# Patient Record
Sex: Male | Born: 1970 | Race: White | Hispanic: No | Marital: Married | Smoking: Never smoker
Health system: Southern US, Community
[De-identification: ages and names within clinical notes are randomized; demographics above are authoritative.]

## PROBLEM LIST (undated history)

## (undated) DIAGNOSIS — M542 Cervicalgia: Secondary | ICD-10-CM

## (undated) HISTORY — PX: SINUS SURGERY WITH INSTATRAK: SHX5215

## (undated) HISTORY — PX: BACK SURGERY: SHX140

---

## 2017-04-03 ENCOUNTER — Encounter (HOSPITAL_BASED_OUTPATIENT_CLINIC_OR_DEPARTMENT_OTHER): Payer: Self-pay

## 2017-04-03 ENCOUNTER — Emergency Department (HOSPITAL_BASED_OUTPATIENT_CLINIC_OR_DEPARTMENT_OTHER)
Admission: EM | Admit: 2017-04-03 | Discharge: 2017-04-04 | Disposition: A | Discharge status: Home / Self Care | Payer: BLUE CROSS/BLUE SHIELD | Attending: Emergency Medicine | Admitting: Emergency Medicine

## 2017-04-03 DIAGNOSIS — Y939 Activity, unspecified: Secondary | ICD-10-CM | POA: Diagnosis not present

## 2017-04-03 DIAGNOSIS — S60450A Superficial foreign body of right index finger, initial encounter: Secondary | ICD-10-CM | POA: Insufficient documentation

## 2017-04-03 DIAGNOSIS — W458XXA Other foreign body or object entering through skin, initial encounter: Secondary | ICD-10-CM | POA: Diagnosis not present

## 2017-04-03 DIAGNOSIS — Y929 Unspecified place or not applicable: Secondary | ICD-10-CM | POA: Insufficient documentation

## 2017-04-03 DIAGNOSIS — Y999 Unspecified external cause status: Secondary | ICD-10-CM | POA: Insufficient documentation

## 2017-04-03 DIAGNOSIS — F17200 Nicotine dependence, unspecified, uncomplicated: Secondary | ICD-10-CM | POA: Diagnosis not present

## 2017-04-03 DIAGNOSIS — S6991XA Unspecified injury of right wrist, hand and finger(s), initial encounter: Secondary | ICD-10-CM

## 2017-04-03 NOTE — ED Triage Notes (Signed)
Fishing lure to left hand x 2 hours-NAD-steady gait

## 2017-04-04 ENCOUNTER — Encounter (HOSPITAL_BASED_OUTPATIENT_CLINIC_OR_DEPARTMENT_OTHER): Payer: Self-pay | Admitting: Emergency Medicine

## 2017-04-04 MED ORDER — LEVOFLOXACIN 750 MG PO TABS: 750.0000 mg | ORAL_TABLET | Freq: Every day | ORAL | 0 refills | Status: AC

## 2017-04-04 MED ORDER — PENTAFLUOROPROP-TETRAFLUOROETH EX AERO
INHALATION_SPRAY | CUTANEOUS | Status: AC
  Filled 2017-04-04: qty 30

## 2017-04-04 MED ORDER — CEPHALEXIN 500 MG PO CAPS: 500.0000 mg | ORAL_CAPSULE | Freq: Four times a day (QID) | ORAL | 0 refills | Status: AC

## 2017-04-04 MED ORDER — LEVOFLOXACIN 750 MG PO TABS
750.0000 mg | ORAL_TABLET | Freq: Once | ORAL | Status: AC
  Administered 2017-04-04: 750 mg via ORAL
  Filled 2017-04-04: qty 1

## 2017-04-04 MED ORDER — PENTAFLUOROPROP-TETRAFLUOROETH EX AERO
INHALATION_SPRAY | Freq: Once | CUTANEOUS | Status: AC
  Administered 2017-04-04: 30 via TOPICAL

## 2017-04-04 MED ORDER — IBUPROFEN 800 MG PO TABS
800.0000 mg | ORAL_TABLET | Freq: Once | ORAL | Status: AC
  Administered 2017-04-04: 800 mg via ORAL
  Filled 2017-04-04: qty 1

## 2017-04-04 MED ORDER — CEPHALEXIN 250 MG PO CAPS
500.0000 mg | ORAL_CAPSULE | Freq: Once | ORAL | Status: AC
  Administered 2017-04-04: 500 mg via ORAL
  Filled 2017-04-04: qty 2

## 2017-04-04 MED ORDER — IBUPROFEN 800 MG PO TABS: 800.0000 mg | ORAL_TABLET | Freq: Once | ORAL | Status: DC

## 2017-04-04 NOTE — ED Provider Notes (Signed)
[facility]   CSN: [id number] Arrival date & time: 04/03/17  2045     History   Chief Complaint Chief Complaint  Patient presents with  . Foreign Body in Skin    HPI Juan Carlson is a 46 y.o. male.  The history is provided by the patient.  Foreign Body  The current episode started 6 to 12 hours ago. Intake: in the finger tuft. Suspected object: a fishing hook. The incident was witnessed. The incident was witnessed/reported by the patient. Causes for concern: was fighting with a [place] Foil he had caught  Pertinent negatives include no fever and no hearing loss. Associated medical issues do not include prior foreign body removal.    History reviewed. No pertinent past medical history.  There are no active problems to display for this patient.   Past Surgical History:  Procedure Laterality Date  . BACK SURGERY    . SINUS SURGERY WITH INSTATRAK         Home Medications    Prior to Admission medications   Medication Sig Start Date End Date Taking? Authorizing Provider  cephALEXin (KEFLEX) 500 MG capsule Take 1 capsule (500 mg total) by mouth 4 (four) times daily. 04/04/17   Emmalise Huard, MD  levofloxacin (LEVAQUIN) 750 MG tablet Take 1 tablet (750 mg total) by mouth daily. X 7 days 04/04/17   Garland Hincapie, MD    Family History No family history on file.  Social History Social History  Substance Use Topics  . Smoking status: Never Smoker  . Smokeless tobacco: Current User  . Alcohol use No     Allergies   Codeine   Review of Systems Review of Systems  Constitutional: Negative for fever.  HENT: Negative for hearing loss.   Skin: Positive for wound.  All other systems reviewed and are negative.    Physical Exam Updated Vital Signs BP (!) 144/102 (BP Location: Right Arm)   Pulse 92   Temp 99 F (37.2 C) (Oral)   Resp 18   Ht  (1.6 m)   Wt 200 lb (90.7 kg)   SpO2 96%   BMI 35.43 kg/m   Physical Exam  Constitutional:  He is oriented to person, place, and time. He appears well-developed and well-nourished. No distress.  HENT:  Head: Normocephalic and atraumatic.  Eyes: Conjunctivae are normal. Pupils are equal, round, and reactive to light.  Neck: Normal range of motion. Neck supple.  Cardiovascular: Normal rate, regular rhythm, normal heart sounds and intact distal pulses.   Pulmonary/Chest: Effort normal and breath sounds normal.  Abdominal: Soft. Bowel sounds are normal. There is no tenderness.  Musculoskeletal: Normal range of motion.       Hands: Neurological: He is alert and oriented to person, place, and time.  Skin: Skin is warm and dry. Capillary refill takes less than 2 seconds.  Psychiatric: He has a normal mood and affect.     ED Treatments / Results  Labs (all labs ordered are listed, but only abnormal results are displayed) Labs Reviewed - No data to display  EKG  EKG Interpretation None       Radiology No results found.  Procedures .Foreign Body Removal Date/Time: 04/04/2017 1:27 AM Performed by: Cy Blamer Authorized by: Cy Blamer  Consent: Verbal consent obtained. Consent given by: patient Patient understanding: patient states understanding of the procedure being performed Patient identity confirmed: arm band Body area: skin General location: upper extremity Location details: left index finger Anesthesia method: cooling  spray.  Sedation: Patient sedated: no Patient restrained: no Patient cooperative: yes Removal mechanism: plyers and direct traction. Dressing: antibiotic ointment Tendon involvement: none Depth: subcutaneous Complexity: simple 1 objects recovered. Objects recovered: fish hook Post-procedure assessment: foreign body removed Patient tolerance: Patient tolerated the procedure well with no immediate complications Comments: Covered with antibiotics for fresh water and fish contamination   (including critical care time)  Medications  Ordered in ED Medications  levofloxacin (LEVAQUIN) tablet 750 mg (750 mg Oral Given 04/04/17 0110)  cephALEXin (KEFLEX) capsule 500 mg (500 mg Oral Given 04/04/17 0110)  ibuprofen (ADVIL,MOTRIN) tablet 800 mg (800 mg Oral Given 04/04/17 0110)  pentafluoroprop-tetrafluoroeth (GEBAUERS) aerosol (30 application Topical Given 04/04/17 0053)       Final Clinical Impressions(s) / ED Diagnoses   Final diagnoses:  Fish hook injury of finger of right hand, initial encounter   Follow up with hand surgery for redness streaking or any concerns.  Exam and vitals are benign and reassuring.  I do not feel advanced imaging is necessary at this time. The patient is nontoxic-appearing and imaging is negative for acute finding.Return for fevers, swelling drainage, streaking up the hand, or pain or any concerns.   After history, exam, and medical workup I feel the patient has been appropriately medically screened and is safe for discharge home. Pertinent diagnoses were discussed with the patient. Patient was given return precautions.  New Prescriptions Discharge Medication List as of 04/04/2017  1:45 AM    START taking these medications   Details  cephALEXin (KEFLEX) 500 MG capsule Take 1 capsule (500 mg total) by mouth 4 (four) times daily., Starting Thu 04/04/2017, Print    levofloxacin (LEVAQUIN) 750 MG tablet Take 1 tablet (750 mg total) by mouth daily. X 7 days, Starting Thu 04/04/2017, Print         Gaspard Isbell, MD 04/04/17 0630

## 2017-04-04 NOTE — ED Notes (Signed)
Pt has soaked finger in betadine/sterile water solution for about 15 min. Pt washed finger well with soap and water. Bacitracin and band aid applied.

## 2018-09-26 ENCOUNTER — Emergency Department (HOSPITAL_BASED_OUTPATIENT_CLINIC_OR_DEPARTMENT_OTHER)
Admission: EM | Admit: 2018-09-26 | Discharge: 2018-09-27 | Disposition: A | Discharge status: Home / Self Care | Payer: BLUE CROSS/BLUE SHIELD | Attending: Emergency Medicine | Admitting: Emergency Medicine

## 2018-09-26 ENCOUNTER — Encounter (HOSPITAL_BASED_OUTPATIENT_CLINIC_OR_DEPARTMENT_OTHER): Payer: Self-pay

## 2018-09-26 ENCOUNTER — Other Ambulatory Visit: Payer: Self-pay

## 2018-09-26 ENCOUNTER — Emergency Department (HOSPITAL_BASED_OUTPATIENT_CLINIC_OR_DEPARTMENT_OTHER): Payer: BLUE CROSS/BLUE SHIELD

## 2018-09-26 DIAGNOSIS — F17220 Nicotine dependence, chewing tobacco, uncomplicated: Secondary | ICD-10-CM | POA: Diagnosis not present

## 2018-09-26 DIAGNOSIS — R29810 Facial weakness: Secondary | ICD-10-CM | POA: Diagnosis present

## 2018-09-26 DIAGNOSIS — G510 Bell's palsy: Secondary | ICD-10-CM | POA: Diagnosis not present

## 2018-09-26 DIAGNOSIS — R2681 Unsteadiness on feet: Secondary | ICD-10-CM | POA: Diagnosis not present

## 2018-09-26 HISTORY — DX: Cervicalgia: M54.2

## 2018-09-26 LAB — DIFFERENTIAL
Abs Immature Granulocytes: 0.02 10*3/uL (ref 0.00–0.07)
BASOS ABS: 0 10*3/uL (ref 0.0–0.1)
Basophils Relative: 1 %
Eosinophils Absolute: 0.2 10*3/uL (ref 0.0–0.5)
Eosinophils Relative: 3 %
Immature Granulocytes: 0 %
LYMPHS PCT: 40 %
Lymphs Abs: 2.4 10*3/uL (ref 0.7–4.0)
Monocytes Absolute: 0.6 10*3/uL (ref 0.1–1.0)
Monocytes Relative: 9 %
NEUTROS ABS: 2.8 10*3/uL (ref 1.7–7.7)
Neutrophils Relative %: 47 %

## 2018-09-26 LAB — CBC
HEMATOCRIT: 45.7 % (ref 39.0–52.0)
HEMOGLOBIN: 15.6 g/dL (ref 13.0–17.0)
MCH: 30.8 pg (ref 26.0–34.0)
MCHC: 34.1 g/dL (ref 30.0–36.0)
MCV: 90.3 fL (ref 80.0–100.0)
NRBC: 0 % (ref 0.0–0.2)
Platelets: 187 10*3/uL (ref 150–400)
RBC: 5.06 MIL/uL (ref 4.22–5.81)
RDW: 11.9 % (ref 11.5–15.5)
WBC: 5.9 10*3/uL (ref 4.0–10.5)

## 2018-09-26 LAB — COMPREHENSIVE METABOLIC PANEL
ALT: 37 U/L (ref 0–44)
AST: 29 U/L (ref 15–41)
Albumin: 4 g/dL (ref 3.5–5.0)
Alkaline Phosphatase: 85 U/L (ref 38–126)
Anion gap: 5 (ref 5–15)
BUN: 17 mg/dL (ref 6–20)
CHLORIDE: 107 mmol/L (ref 98–111)
CO2: 27 mmol/L (ref 22–32)
CREATININE: 1.17 mg/dL (ref 0.61–1.24)
Calcium: 9 mg/dL (ref 8.9–10.3)
GFR calc Af Amer: >60 mL/min (ref >60)
GFR calc non Af Amer: >60 mL/min (ref >60)
Glucose, Bld: 85 mg/dL (ref 70–99)
Potassium: 4 mmol/L (ref 3.5–5.1)
Sodium: 139 mmol/L (ref 135–145)
Total Bilirubin: 0.5 mg/dL (ref 0.3–1.2)
Total Protein: 6.7 g/dL (ref 6.5–8.1)

## 2018-09-26 LAB — TROPONIN I

## 2018-09-26 MED ORDER — IOPAMIDOL (ISOVUE-370) INJECTION 76%
100.0000 mL | Freq: Once | INTRAVENOUS | Status: AC | PRN
  Administered 2018-09-26: 100 mL via INTRAVENOUS

## 2018-09-26 NOTE — ED Notes (Signed)
Pt arrived via POV as transfer from [facility].  MRI notified that pt in ED waiting room.  Pt updated on wait for MRI and treatment room.

## 2018-09-26 NOTE — ED Notes (Addendum)
Wife states facial droop seems to be better.  No facial droop noted at this time.  Pt reports L sided arm weakness from "nerve problem".  Grips strong and equal and no arm drift.

## 2018-09-26 NOTE — ED Notes (Signed)
Sent from [facility] [employer] for a mri

## 2018-09-26 NOTE — ED Triage Notes (Signed)
Per wife she noticed right side facial droop last night 7pm-pt NAD-steady gait

## 2018-09-26 NOTE — ED Provider Notes (Addendum)
[facility] EMERGENCY DEPARTMENT Provider Note   CSN: [id number] Arrival date & time: 09/26/18  1639     History   Chief Complaint Chief Complaint  Patient presents with  . Facial Droop    HPI Hamdi Kley is a 47 y.o. male.  The history is provided by the patient and the spouse. No language interpreter was used.   Wilmar Prabhakar is a 47 y.o. male who presents to the Emergency Department complaining of facial droop. He presents to the emergency department committee by his wife for evaluation of facial droop. His wife noticed that his right face was drooping around 7 PM last night. This morning and today his droop looks improved. He denies any significant symptoms. She called the PCPs office for an appointment and they recommended ED evaluation. He denies any headache, vision changes, difficulty with speech, weakness. He has been experiencing three weeks of left arm paresthesias and pain at that he attributes to a pinched nerve in his neck. He is recently had an MRI for this. He has a known heart murmur, no known medical problems. He is a family history of diabetes, no additional medical problems. He denies any tobacco, alcohol, drug use. Past Medical History:  Diagnosis Date  . Neck pain     There are no active problems to display for this patient.   Past Surgical History:  Procedure Laterality Date  . BACK SURGERY    . SINUS SURGERY WITH INSTATRAK          Home Medications    Prior to Admission medications   Medication Sig Start Date End Date Taking? Authorizing Provider  cephALEXin (KEFLEX) 500 MG capsule Take 1 capsule (500 mg total) by mouth 4 (four) times daily. 04/04/17   Palumbo, April, MD  levofloxacin (LEVAQUIN) 750 MG tablet Take 1 tablet (750 mg total) by mouth daily. X 7 days 04/04/17   Cy Blamer, MD    Family History No family history on file.  Social History Social History   Tobacco Use  . Smoking status: Never Smoker  . Smokeless  tobacco: Current User    Types: Chew  Substance Use Topics  . Alcohol use: Yes    Comment: occ  . Drug use: No     Allergies   Codeine   Review of Systems Review of Systems  All other systems reviewed and are negative.    Physical Exam Updated Vital Signs BP (!) 141/105 (BP Location: Right Arm)   Pulse 90   Temp 98.4 F (36.9 C) (Oral)   Resp 18   Ht 5\' 4"  (1.626 m)   Wt 91.2 kg   SpO2 100%   BMI 34.50 kg/m   Physical Exam  Constitutional: He is oriented to person, place, and time. He appears well-developed and well-nourished.  HENT:  Head: Normocephalic and atraumatic.  Mouth/Throat: Oropharynx is clear and moist.  Right TM with scarring, left TM within normal limits.  Eyes: Pupils are equal, round, and reactive to light. EOM are normal.  Cardiovascular: Normal rate and regular rhythm.  No murmur heard. Pulmonary/Chest: Effort normal and breath sounds normal. No respiratory distress.  Abdominal: Soft. There is no tenderness. There is no rebound and no guarding.  Musculoskeletal: He exhibits no edema or tenderness.  Neurological: He is alert and oriented to person, place, and time.  No significant facial asymmetry. EOM I. Sensation to light touch intact in all four extremities. Visual fields are grossly intact. No ataxia on finger to nose bilaterally.  No pronator drift. Five out of five strength in all four extremities.  Skin: Skin is warm and dry.  Psychiatric: He has a normal mood and affect. His behavior is normal.  Nursing note and vitals reviewed.    ED Treatments / Results  Labs (all labs ordered are listed, but only abnormal results are displayed) Labs Reviewed - No data to display  EKG None  Radiology No results found.  Procedures Procedures (including critical care time)  Medications Ordered in ED Medications - No data to display   Initial Impression / Assessment and Plan / ED Course  I have reviewed the triage vital signs and the  nursing notes.  Pertinent labs & imaging results that were available during my care of the patient were reviewed by me and considered in my medical decision making (see chart for details).  Clinical Course as of Sep 28 1500  Sat Sep 27, 2018  0229 Comprehensive metabolic panel [CI]    Clinical Course User Index [CI] Shaune Pollack, MD   Pt here for evaluation of right sided facial weakness, no appreciable weakness on neurologic exam in the emergency department. Given resolution of weakness, Bell's palsy is less likely. CTA head is negative for acute abnormality. Plan to transfer to [facility] for further evaluation with MRI. Discussed with Dr. Lockie Mola who accepts the patient and transfer.  Final Clinical Impressions(s) / ED Diagnoses   Final diagnoses:  None    ED Discharge Orders    None       Tilden Fossa, MD 09/27/18 0126    Tilden Fossa, MD 09/28/18 1501

## 2018-09-26 NOTE — ED Notes (Signed)
Patient transported to CT 

## 2018-09-26 NOTE — ED Provider Notes (Signed)
Assumed care at 11 PM.  Briefly, the patient was seen at med center for evaluation of right facial droop.  Facial droop was not significant on exam and the case was discussed with neurology.  Patient transferred for MRI for evaluation of possible occult stroke versus Bell's palsy.  On my exam, the patient does appear to have a very subtle nasolabial flattening on the right.  Given persistent symptoms, suspect Bell's but will follow-up MRI. Remainder of neuro exam is unremarkable.  MRI shows chronic small vessel disease but no new stroke.  Patient continues to have mild right nasolabial flattening.  He is able to close his eye without difficulty.  I discussed the case and image findings with Dr. Laurence Slate. Suspect Bell's. Moreover, patient is a low risk ABCD score. CT Angio unremarkable. Will d/c with outpt Neuro follow-up, tx for Bells, and will have him start a low-dose ASA until cleared by Neuro. Discused his MRI findings and need for PCP f/u as well re: HTN, lipids, etc.   Shaune Pollack, MD 09/27/18 [phone]

## 2018-09-26 NOTE — ED Notes (Signed)
Report called to [facility] RN charge ED cone

## 2018-09-27 ENCOUNTER — Emergency Department (HOSPITAL_COMMUNITY): Payer: BLUE CROSS/BLUE SHIELD

## 2018-09-27 MED ORDER — PREDNISONE 20 MG PO TABS: 60.0000 mg | ORAL_TABLET | Freq: Every day | ORAL | 0 refills | Status: AC

## 2018-09-27 MED ORDER — VALACYCLOVIR HCL 1 G PO TABS: 1000.0000 mg | ORAL_TABLET | Freq: Three times a day (TID) | ORAL | 0 refills | Status: AC

## 2018-09-27 MED ORDER — ASPIRIN EC 81 MG PO TBEC: 81.0000 mg | DELAYED_RELEASE_TABLET | Freq: Every day | ORAL | 0 refills | Status: AC

## 2018-09-27 MED ORDER — OMEPRAZOLE 20 MG PO CPDR: 20.0000 mg | DELAYED_RELEASE_CAPSULE | Freq: Every day | ORAL | 0 refills | Status: AC

## 2018-09-27 NOTE — ED Notes (Signed)
Pt returned from mri

## 2018-09-27 NOTE — Discharge Instructions (Signed)
As we discussed, your MRI did show signs of likely chronic hypertension and small vascular disease.  It is important that you follow-up with your primary doctor to discuss this.  It is also important to follow-up with a neurologist for further evaluation.  I would recommend starting a low dose, 81 mg aspirin for stroke prevention until told otherwise by neurology. You can buy over-the-counter Aspirin (enteric-coated, or safe for GI tract).

## 2018-09-27 NOTE — ED Notes (Signed)
To mri 

## 2019-12-03 IMAGING — MR MR HEAD W/O CM
10 of 15 series · 33 of 48 positions shown · non-contrast
Comparison: Head CT 09/26/2018

CLINICAL DATA: Right facial droop and unsteady gait

EXAM:
MRI HEAD WITHOUT CONTRAST
TECHNIQUE: Multiplanar, multiecho pulse sequences of the brain and surrounding
structures were obtained without intravenous contrast.

[Series 5: ax dwi_tracew · axial · 3.0mm · 1.50mm/px · z∈[-106,+47]mm · 6 of 108 slices shown]
[im 1/108]
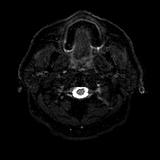
[im 22/108]
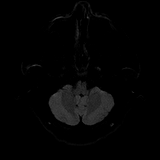
[im 43/108]
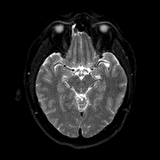
[im 65/108]
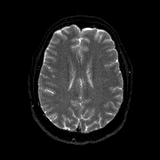
[im 86/108]
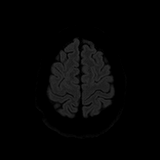
[im 108/108]
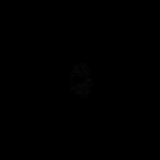

[Series 6: ax dwi_adc · axial · 3.0mm · 1.50mm/px · z∈[-106,+47]mm · 3 of 54 slices shown]
[im 1/54]
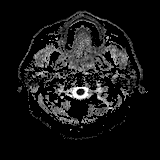
[im 27/54]
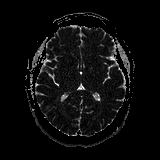
[im 54/54]
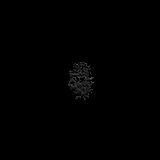

[Series 7: cor dwi_tracew · coronal · 5.0mm · 1.44mm/px · 5 of 76 slices shown]
[im 1/76]
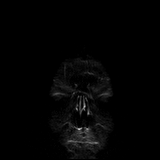
[im 19/76]
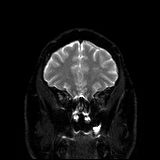
[im 38/76]
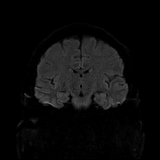
[im 57/76]
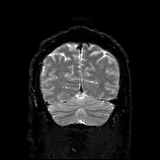
[im 76/76]
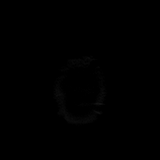

[Series 8: cor dwi_adc · coronal · 5.0mm · 1.44mm/px · 3 of 38 slices shown]
[im 1/38]
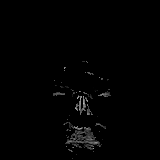
[im 19/38]
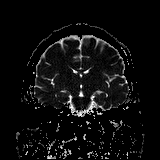
[im 38/38]
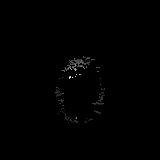

[Series 9: T1 · sagittal · 5.0mm · 0.75mm/px · 2 of 28 slices shown]
[im 1/28]
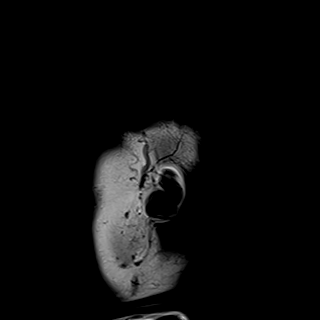
[im 28/28]
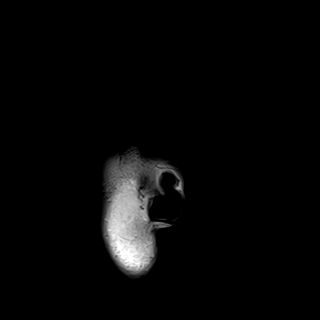

[Series 10: T2 · axial · 5.0mm · 0.72mm/px · z∈[-114,+53]mm · 2 of 30 slices shown (1 of 2)]
[im 1/30]
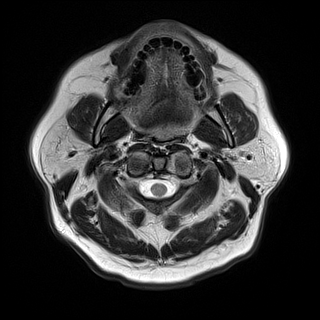
[im 30/30]
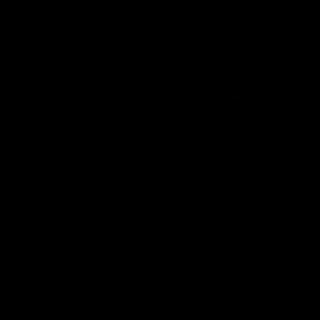

[Series 11: FLAIR · axial · 5.0mm · 0.45mm/px · z∈[-113,+49]mm · 2 of 29 slices shown]
[im 1/29]
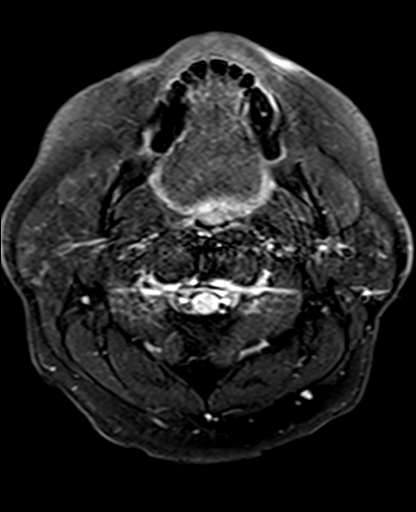
[im 29/29]
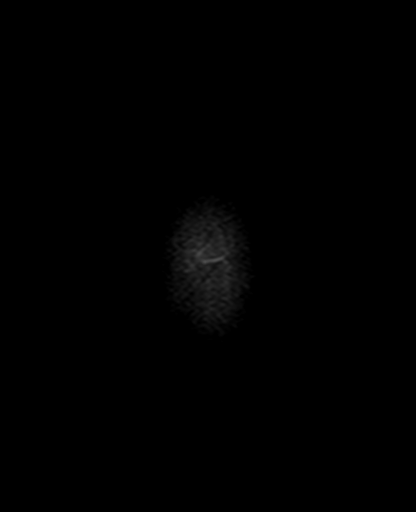

[Series 12: swi_images · axial · 3.0mm · 0.90mm/px · z∈[-113,+58]mm · 4 of 60 slices shown]
[im 1/60]
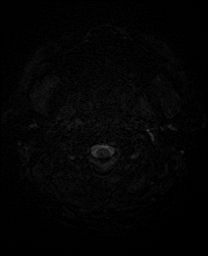
[im 20/60]
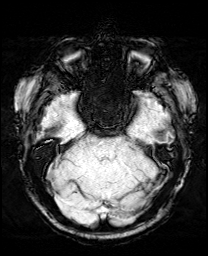
[im 40/60]
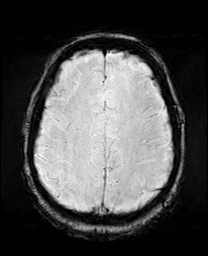
[im 60/60]
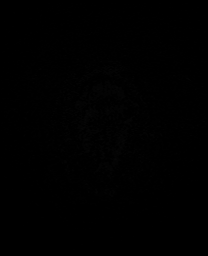

[Series 13: mip_images(sw) · axial · 24.0mm · 0.90mm/px · z∈[-103,+47]mm · 4 of 53 slices shown]
[im 1/53]
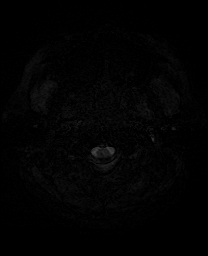
[im 18/53]
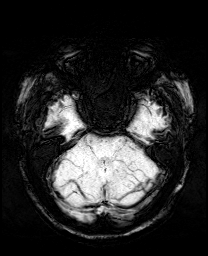
[im 35/53]
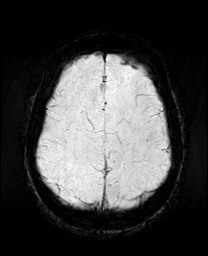
[im 53/53]
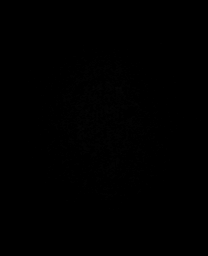

[Series 15: T2 · coronal · 5.0mm · 0.34mm/px · 2 of 32 slices shown (2 of 2)]
[im 1/32]
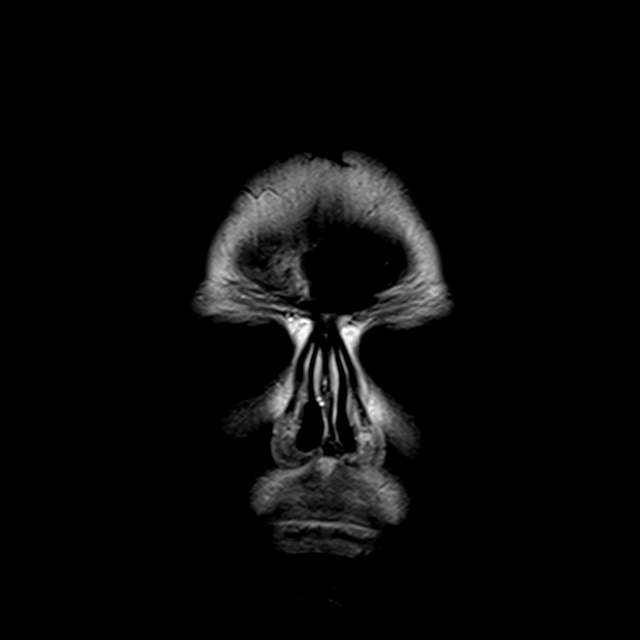
[im 32/32]
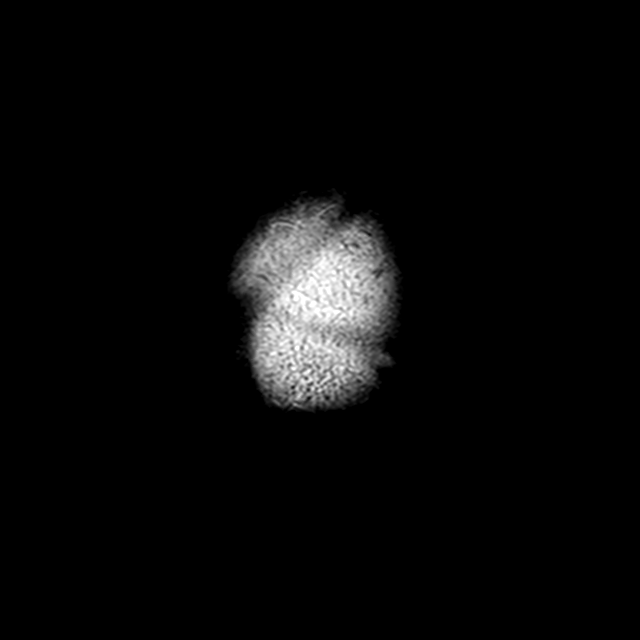

[33 of 48 positions shown; findings below may reference images not displayed]

FINDINGS: BRAIN: There is no acute infarct, acute hemorrhage, hydrocephalus or
extra-axial collection. The midline structures are normal. No
midline shift or other mass effect. There are no old infarcts.
Scattered foci of hyperintense T2-weighted signal within the white
matter in a nonspecific pattern that may be seen in the context of
migraine headaches, but is also seen in asymptomatic patients. The
cerebral and cerebellar volume are age-appropriate.
Susceptibility-sensitive sequences show no chronic microhemorrhage
or superficial siderosis.

VASCULAR: Major intracranial arterial and venous sinus flow voids
are normal.

SKULL AND UPPER CERVICAL SPINE: Calvarial bone marrow signal is
normal. There is no skull base mass. Visualized upper cervical spine
and soft tissues are normal.

SINUSES/ORBITS: No fluid levels or advanced mucosal thickening. No
mastoid or middle ear effusion. The orbits are normal.
IMPRESSION: Nonspecific scattered foci of white matter hyperintensity. Though
this may be seen in the setting of migraine headaches or
vasculopathy such as early chronic small vessel disease, it is also
a common incidental finding in asymptomatic patients of this age
group. Otherwise normal examination.
